# Patient Record
Sex: Male | Born: 1995 | Race: Black or African American | Hispanic: No | Marital: Single | Smoking: Never smoker
Health system: Southern US, Community
[De-identification: ages and names within clinical notes are randomized; demographics above are authoritative.]

---

## 2013-07-05 ENCOUNTER — Ambulatory Visit (INDEPENDENT_AMBULATORY_CARE_PROVIDER_SITE_OTHER): Payer: BC Managed Care – PPO | Admitting: Family Medicine

## 2013-07-05 VITALS — BP 100/66 | HR 60 | Temp 98.3°F | Resp 16 | Ht 67.0 in | Wt 174.2 lb

## 2013-07-05 DIAGNOSIS — Z0000 Encounter for general adult medical examination without abnormal findings: Secondary | ICD-10-CM

## 2013-07-05 NOTE — Progress Notes (Signed)
Patient ID: Melvin Berry MRN: [id number], DOB: [birth year] 18 y.o. Date of Encounter: 07/05/2013, 4:52 PM  Primary Physician: No primary provider on file.  Chief Complaint: Physical (CPE)  HPI: 18 y.o. y/o male with history noted below here for CPE.  Doing well. No issues/complaints.  Review of Systems: Consitutional: No fever, chills, fatigue, night sweats, lymphadenopathy, or weight changes. Eyes: No visual changes, eye redness, or discharge. ENT/Mouth: Ears: No otalgia, tinnitus, hearing loss, discharge. Nose: No congestion, rhinorrhea, sinus pain, or epistaxis. Throat: No sore throat, post nasal drip, or teeth pain. Cardiovascular: No CP, palpitations, diaphoresis, DOE, edema, orthopnea, PND. Respiratory: No cough, hemoptysis, SOB, or wheezing. Gastrointestinal: No anorexia, dysphagia, reflux, pain, nausea, vomiting, hematemesis, diarrhea, constipation, BRBPR, or melena. Genitourinary: No dysuria, frequency, urgency, hematuria, incontinence, nocturia, decreased urinary stream, discharge, impotence, or testicular pain/masses. Musculoskeletal: No decreased ROM, myalgias, stiffness, joint swelling, or weakness. Skin: No rash, erythema, lesion changes, pain, warmth, jaundice, or pruritis. Neurological: No headache, dizziness, syncope, seizures, tremors, memory loss, coordination problems, or paresthesias. Psychological: No anxiety, depression, hallucinations, SI/HI. Endocrine: No fatigue, polydipsia, polyphagia, polyuria, or known diabetes. All other systems were reviewed and are otherwise negative.  History reviewed. No pertinent past medical history.   History reviewed. No pertinent past surgical history.  Home Meds:  Prior to Admission medications   Not on File    Allergies: No Known Allergies  History   Social History  . Marital Status: Single    Spouse Name: N/A    Number of Children: N/A  . Years of Education: N/A   Occupational History  . Not on file.   Social  History Main Topics  . Smoking status: Never Smoker   . Smokeless tobacco: Not on file  . Alcohol Use: No  . Drug Use: No  . Sexual Activity: Not on file   Other Topics Concern  . Not on file   Social History Narrative  . No narrative on file    History reviewed. No pertinent family history.  Physical Exam: Blood pressure 100/66, pulse 60, temperature 98.3 F (36.8 C), temperature source Oral, resp. rate 16, height 5\' 7"  (1.702 m), weight 174 lb 3.2 oz (79.017 kg), SpO2 100.00%.  General: Well developed, well nourished, in no acute distress. HEENT: Normocephalic, atraumatic. Conjunctiva pink, sclera non-icteric. Pupils 2 mm constricting to 1 mm, round, regular, and equally reactive to light and accomodation. EOMI. Internal auditory canal clear. TMs with good cone of light and without pathology. Nasal mucosa pink. Nares are without discharge. No sinus tenderness. Oral mucosa pink. Dentition good. Pharynx without exudate.   Neck: Supple. Trachea midline. No thyromegaly. Full ROM. No lymphadenopathy. Lungs: Clear to auscultation bilaterally without wheezes, rales, or rhonchi. Breathing is of normal effort and unlabored. Cardiovascular: RRR with S1 S2. No murmurs, rubs, or gallops appreciated. Distal pulses 2+ symmetrically. No carotid or abdominal bruits Abdomen: Soft, non-tender, non-distended with normoactive bowel sounds. No hepatosplenomegaly or masses. No rebound/guarding. No CVA tenderness. Without hernias.   Genitourinary:  circumcised male. No penile lesions. Testes descended bilaterally, and smooth without tenderness or masses.  Musculoskeletal: Full range of motion and 5/5 strength throughout. Without swelling, atrophy, tenderness, crepitus, or warmth. Extremities without clubbing, cyanosis, or edema. Calves supple. Skin: Warm and moist without erythema, ecchymosis, wounds, or rash. Neuro: A+Ox3. CN II-XII grossly intact. Moves all extremities spontaneously. Full sensation  throughout. Normal gait. DTR 2+ throughout upper and lower extremities. Finger to nose intact. Psych:  Responds to questions appropriately with a normal  affect.   Studies: none  Assessment/Plan:  18 y.o. y/o  male here for CPE which is normal -  Signed, Elvina SidleKurt Aadhav Uhlig, MD 07/05/2013 4:52 PM

## 2013-07-05 NOTE — Patient Instructions (Signed)
Health Maintenance, 44- to 20-Year-Old SCHOOL PERFORMANCE After high school completion, the young adult may be attending college, Hotel manager or vocational school, or entering the [employer] or the work force. SOCIAL AND EMOTIONAL DEVELOPMENT The young adult establishes adult relationships and explores sexual identity. Young adults may be living at home or in a college dorm or apartment. Increasing independence is important with young adults. Throughout these years, young adults should assume responsibility of their own health care. RECOMMENDED IMMUNIZATIONS  Influenza vaccine.  All adults should be immunized every year.  All adults, including pregnant women and people with hives-only allergy to eggs can receive the inactivated influenza (IIV) vaccine.  Adults aged 44 49 years can receive the recombinant influenza (RIV) vaccine. The RIV vaccine does not contain any egg protein.  Tetanus, diphtheria, and acellular pertussis (Td, Tdap) vaccine.  Pregnant women should receive 1 dose of Tdap vaccine during each pregnancy. The dose should be obtained regardless of the length of time since the last dose. Immunization is preferred during the 27th to 36th week of gestation.  An adult who has not previously received Tdap or who does not know his or her vaccine status should receive 1 dose of Tdap. This initial dose should be followed by tetanus and diphtheria toxoids (Td) booster doses every 10 years.  Adults with an unknown or incomplete history of completing a 3-dose immunization series with Td-containing vaccines should begin or complete a primary immunization series including a Tdap dose.  Adults should receive a Td booster every 10 years.  Varicella vaccine.  An adult without evidence of immunity to varicella should receive 2 doses or a second dose if he or she has previously received 1 dose.  Pregnant females who do not have evidence of immunity should receive the first dose after pregnancy.  This first dose should be obtained before leaving the health care facility. The second dose should be obtained 4 8 weeks after the first dose.  Human papillomavirus (HPV) vaccine.  Females aged 15 26 years who have not received the vaccine previously should obtain the 3-dose series.  The vaccine is not recommended for use in pregnant females. However, pregnancy testing is not needed before receiving a dose. If a male is found to be pregnant after receiving a dose, no treatment is needed. In that case, the remaining doses should be delayed until after the pregnancy.  Males aged 12 21 years who have not received the vaccine previously should receive the 3-dose series. Males aged 39 26 years may be immunized.  Immunization is recommended through the age of 1 years for any male who has sex with males and did not get any or all doses earlier.  Immunization is recommended for any person with an immunocompromised condition through the age of 27 years if he or she did not get any or all doses earlier.  During the 3-dose series, the second dose should be obtained 4 8 weeks after the first dose. The third dose should be obtained 24 weeks after the first dose and 16 weeks after the second dose.  Measles, mumps, and rubella (MMR) vaccine.  Adults born in [birth year] or later should have 1 or more doses of MMR vaccine unless there is a contraindication to the vaccine or there is laboratory evidence of immunity to each of the three diseases.  A routine second dose of MMR vaccine should be obtained at least 28 days after the first dose for students attending postsecondary schools, health care workers, or international travelers.  For females of childbearing age, rubella immunity should be determined. If there is no evidence of immunity, females who are not pregnant should be vaccinated. If there is no evidence of immunity, females who are pregnant should delay immunization until after pregnancy.  Pneumococcal  13-valent conjugate (PCV13) vaccine.  When indicated, a person who is uncertain of his or her immunization history and has no record of immunization should receive the PCV13 vaccine.  An adult aged 19 years or older who has certain medical conditions and has not been previously immunized should receive 1 dose of PCV13 vaccine. This PCV13 should be followed with a dose of pneumococcal polysaccharide (PPSV23) vaccine. The PPSV23 vaccine dose should be obtained at least 8 weeks after the dose of PCV13 vaccine.  An adult aged 19 years or older who has certain medical conditions and previously received 1 or more doses of PPSV23 vaccine should receive 1 dose of PCV13. The PCV13 vaccine dose should be obtained 1 or more years after the last PPSV23 vaccine dose.  Pneumococcal polysaccharide (PPSV23) vaccine.  When PCV13 is also indicated, PCV13 should be obtained first.  An adult younger than age 65 years who has certain medical conditions should be immunized.  Any person who resides in a nursing home or long-term care facility should be immunized.  An adult smoker should be immunized.  People with an immunocompromised condition and certain other conditions should receive both PCV13 and PPSV23 vaccines.  People with human immunodeficiency virus (HIV) infection should be immunized as soon as possible after diagnosis.  Immunization during chemotherapy or radiation therapy should be avoided.  Routine use of PPSV23 vaccine is not recommended for American Indians, [place] Natives, or people younger than 65 years unless there are medical conditions that require PPSV23 vaccine.  When indicated, people who have unknown immunization and have no record of immunization should receive PPSV23 vaccine.  One-time revaccination 5 years after the first dose of PPSV23 is recommended for people aged 19 64 years who have chronic kidney failure, nephrotic syndrome, asplenia, or immunocompromised  conditions.  Meningococcal vaccine.  Adults with asplenia or persistent complement component deficiencies should receive 2 doses of quadrivalent meningococcal conjugate (MenACWY-D) vaccine. The doses should be obtained at least 2 months apart.  Microbiologists working with certain meningococcal bacteria, military recruits, people at risk during an outbreak, and people who travel to or live in countries with a high rate of meningitis should be immunized.  A first-year college student up through age 21 years who is living in a residence hall should receive a dose if he or she did not receive a dose on or after his or her 16th birthday.  Adults who have certain high-risk conditions should receive one or more doses of vaccine.  Hepatitis A vaccine.  Adults who wish to be protected from this disease, have certain high-risk conditions, work with hepatitis A-infected animals, work in hepatitis A research labs, or travel to or work in countries with a high rate of hepatitis A should be immunized.  Adults who were previously unvaccinated and who anticipate close contact with an international adoptee during the first 60 days after arrival in the [place] from a country with a high rate of hepatitis A should be immunized.  Hepatitis B vaccine.  Adults who wish to be protected from this disease, have certain high-risk conditions, may be exposed to blood or other infectious body fluids, are household contacts or sex partners of hepatitis B positive people, are clients or workers in   certain care facilities, or travel to or work in countries with a high rate of hepatitis B should be immunized.  Haemophilus influenzae type b (Hib) vaccine.  A previously unvaccinated person with asplenia or sickle cell disease or having a scheduled splenectomy should receive 1 dose of Hib vaccine.  Regardless of previous immunization, a recipient of a hematopoietic stem cell transplant should receive a 3-dose series 6  12 months after his or her successful transplant.  Hib vaccine is not recommended for adults with HIV infection. TESTING Annual screening for vision and hearing problems is recommended. Vision should be screened objectively at least once between 18 21 years of age. The young adult may be screened for anemia or tuberculosis. Young adults should have a blood test to check for high cholesterol during this time period. Young adults should be screened for use of alcohol and drugs. If the young adult is sexually active, screening for sexually transmitted infections, pregnancy, or HIV may be performed.  NUTRITION AND ORAL HEALTH  Adequate calcium intake is important. Consume 3 servings of low-fat milk and dairy products daily. For those who do not drink milk or consume dairy products, calcium enriched foods, such as juice, bread, or cereal, dark, leafy greens, or canned fish are alternate sources of calcium.  Drink plenty of water. Limit fruit juice to 8 12 ounces (240 360 mL) each day. Avoid sugary beverages or sodas.  Discourage skipping meals, especially breakfast. Young adults should eat a good variety of vegetables and fruits, as well as lean meats.  Avoid foods high in fat, salt, or sugar, such as candy, chips, and cookies.  Encourage young adults to participate in meal planning and preparation.  Eat meals together as a family whenever possible. Encourage conversation at mealtime.  Limit fast food choices and eating out at restaurants.  Brush teeth twice a day and floss.  Schedule dental exams twice a year. SLEEP Regular sleep habits are important. PHYSICAL, SOCIAL, AND EMOTIONAL DEVELOPMENT  One hour of regular physical activity daily is recommended. Continue to participate in sports.  Encourage young adults to develop their own interests and consider community service or volunteerism.  Provide guidance to the young adult in making decisions about college and work plans.  Make sure  that young adults know that they should never be in a situation that makes them uncomfortable, and they should tell partners if they do not want to engage in sexual activity.  Talk to the young adult about body image. Eating disorders may be noted at this time. Young adults may also be concerned about being overweight. Monitor the young adult for weight gain or loss.  Mood disturbances, depression, anxiety, alcoholism, or attention problems may be noted in young adults. Talk to the caregiver if there are concerns about mental illness.  Negotiate limit setting and independent decision making.  Encourage the young adult to handle conflict without physical violence.  Avoid loud noises which may impair hearing.  Limit television and computer time to 2 hours each day. Individuals who engage in excessive sedentary activity are more likely to become overweight. RISK BEHAVIORS  Sexually active young adults need to take precautions against pregnancy and sexually transmitted infections. Talk to young adults about contraception.  Provide a tobacco-free and drug-free environment for the young adult. Talk to the young adult about drug, tobacco, and alcohol use among friends or at friend's homes. Make sure the young adult knows that smoking tobacco or marijuana and taking drugs have health consequences and   may impact brain development.  Teach the young adult about appropriate use of over-the-counter or prescription medicines.  Establish guidelines for driving and for riding with friends.  Talk to young adults about the risks of drinking and driving or boating. Encourage the young adult to call you if he or she or friends have been drinking or using drugs.  Remind young adults to wear seat belts at all times in cars and life vests in boats.  Young adults should always wear a properly fitted helmet when they are riding a bicycle.  Use caution with all-terrain vehicles (ATVs) or other motorized  vehicles.  Do not keep handguns in the home. (If you do, the gun and ammunition should be locked separately and out of the young adult's access.)  Equip your home with smoke detectors and change the batteries regularly. Make sure all family members know the fire escape plans for your home.  Teach young adults not to swim alone and not to dive in shallow water.  All individuals should wear sunscreen when out in the sun. This minimizes sunburning. WHAT'S NEXT? Young adults should visit their pediatrician or family physician yearly. By young adulthood, health care should be transitioned to a family physician or internal medicine specialist. Sexually active females may want to begin annual physical exams with a gynecologist. Document Released: 08/28/2006 Document Revised: 09/27/2012 Document Reviewed: 09/17/2006 ExitCare Patient Information 2014 [employer].  

## 2013-10-02 DIAGNOSIS — S022XXA Fracture of nasal bones, initial encounter for closed fracture: Secondary | ICD-10-CM | POA: Insufficient documentation

## 2013-10-02 DIAGNOSIS — Y92838 Other recreation area as the place of occurrence of the external cause: Secondary | ICD-10-CM

## 2013-10-02 DIAGNOSIS — W219XXA Striking against or struck by unspecified sports equipment, initial encounter: Secondary | ICD-10-CM | POA: Insufficient documentation

## 2013-10-02 DIAGNOSIS — Y9374 Activity, frisbee: Secondary | ICD-10-CM | POA: Insufficient documentation

## 2013-10-02 DIAGNOSIS — Y9239 Other specified sports and athletic area as the place of occurrence of the external cause: Secondary | ICD-10-CM | POA: Insufficient documentation

## 2013-10-03 ENCOUNTER — Emergency Department (HOSPITAL_COMMUNITY)
Admission: EM | Admit: 2013-10-03 | Discharge: 2013-10-03 | Disposition: A | Discharge status: Home / Self Care | Payer: BC Managed Care – PPO | Attending: Emergency Medicine | Admitting: Emergency Medicine

## 2013-10-03 ENCOUNTER — Encounter (HOSPITAL_COMMUNITY): Payer: Self-pay | Admitting: Emergency Medicine

## 2013-10-03 ENCOUNTER — Emergency Department (HOSPITAL_COMMUNITY): Payer: BC Managed Care – PPO

## 2013-10-03 DIAGNOSIS — S022XXA Fracture of nasal bones, initial encounter for closed fracture: Secondary | ICD-10-CM

## 2013-10-03 MED ORDER — IBUPROFEN 600 MG PO TABS: 600.0000 mg | ORAL_TABLET | Freq: Four times a day (QID) | ORAL | Status: DC | PRN

## 2013-10-03 NOTE — ED Notes (Signed)
Pt here for c/o swollen nose from injury while playing frisbee

## 2013-10-03 NOTE — ED Notes (Signed)
Thursday playing ultimate frisbee and a taller man reached over him and when the taller guy pulled his arm arm hit the patient on the nose.  Nose swollen, no bleeding.

## 2013-10-03 NOTE — Discharge Instructions (Signed)
Apply ice pack to the nose several times a day. Ibuprofen for pain. He did have a fracture of the left nasal bone. Followup with ear nose throat doctor as referred.  Nasal Fracture A nasal fracture is a break or crack in the bones of the nose. A minor break usually heals in a month. You often will receive black eyes from a nasal fracture. This is not a cause for concern. The black eyes will go away over 1 to 2 weeks.  DIAGNOSIS  Your caregiver may want to examine you if you are concerned about a fracture of the nose. X-rays of the nose may not show a nasal fracture even when one is present. Sometimes your caregiver must wait 1 to 5 days after the injury to re-check the nose for alignment and to take additional X-rays. Sometimes the caregiver must wait until the swelling has gone down. TREATMENT Minor fractures that have caused no deformity often do not require treatment. More serious fractures where bones are displaced may require surgery. This will take place after the swelling is gone. Surgery will stabilize and align the fracture. HOME CARE INSTRUCTIONS   Put ice on the injured area.  Put ice in a plastic bag.  Place a towel between your skin and the bag.  Leave the ice on for 15-20 minutes, 03-04 times a day.  Take medications as directed by your caregiver.  Only take over-the-counter or prescription medicines for pain, discomfort, or fever as directed by your caregiver.  If your nose starts bleeding, squeeze the soft parts of the nose against the center wall while you are sitting in an upright position for 10 minutes.  Contact sports should be avoided for at least 3 to 4 weeks or as directed by your caregiver. SEEK MEDICAL CARE IF:  Your pain increases or becomes severe.  You continue to have nosebleeds.  The shape of your nose does not return to normal within 5 days.  You have pus draining from the nose. SEEK IMMEDIATE MEDICAL CARE IF:   You have bleeding from your nose that  does not stop after 20 minutes of pinching the nostrils closed and keeping ice on the nose.  You have clear fluid draining from your nose.  You notice a grape-like swelling on the dividing wall between the nostrils (septum). This is a collection of blood (hematoma) that must be drained to help prevent infection.  You have difficulty moving your eyes.  You have recurrent vomiting. Document Released: 05/30/2000 Document Revised: 08/25/2011 Document Reviewed: 09/16/2010 [facility] Patient Information 2014 [place], [place].

## 2013-10-03 NOTE — ED Provider Notes (Signed)
CSN: [id number]     Arrival date & time 10/02/13  2347 History   First MD Initiated Contact with Patient 10/03/13 0030     Chief Complaint  Patient presents with  . Facial Injury     (Consider location/radiation/quality/duration/timing/severity/associated sxs/prior Treatment) HPI Melvin Berry is a 18 y.o. male [place] [facility] after an injury to his nose. States the injury occurred 3 days ago while playing ultimate Frisbee and another player hit in the nose. Patient reports that he continues to have pain in his nose. He denies any nose bleeding. He denies any difficulty breathing. He states he did not try any treatment for this. States that he feels like his nose is crooked. No other complaints. Denies any headaches, no loss of consciousness, no other complaints.  History reviewed. No pertinent past medical history. History reviewed. No pertinent past surgical history. History reviewed. No pertinent family history. History  Substance Use Topics  . Smoking status: Never Smoker   . Smokeless tobacco: Not on file  . Alcohol Use: No    Review of Systems  Constitutional: Negative for fever and chills.  HENT: Positive for facial swelling. Negative for nosebleeds.   Respiratory: Negative for shortness of breath.   Skin: Negative for wound.  Neurological: Negative for headaches.      Allergies  Review of patient's allergies indicates no known allergies.  Home Medications   Prior to Admission medications   Not on File   BP 141/72  Pulse 85  Temp(Src) 98.5 F (36.9 C) (Oral)  Resp 20  Ht 5\' 7"  (1.702 m)  Wt 186 lb (84.369 kg)  BMI 29.12 kg/m2  SpO2 98% Physical Exam  Nursing note and vitals reviewed. Constitutional: He appears well-developed and well-nourished. No distress.  HENT:  Head: Normocephalic and atraumatic.  Right Ear: External ear normal.  Left Ear: External ear normal.  Mouth/Throat: Oropharynx is clear and moist.  Mild swelling noted to the bridge  of the nose. Tender to palpation. Nasal canal normal with no septal hematoma.  Eyes: Conjunctivae and EOM are normal. Pupils are equal, round, and reactive to light.  Neck: Neck supple.  Cardiovascular: Normal rate, regular rhythm and normal heart sounds.   Pulmonary/Chest: Effort normal. No respiratory distress. He has no wheezes. He has no rales.  Musculoskeletal: He exhibits no edema.  Neurological: He is alert.  Skin: Skin is warm and dry.    ED Course  Procedures (including critical care time) Labs Review Labs Reviewed - No data to display  Imaging Review Dg Nasal Bones  10/03/2013   CLINICAL DATA:  Pain and swelling in the nose after injury.  EXAM: NASAL BONES - 3+ VIEW  COMPARISON:  None.  FINDINGS: Transverse fracture of the distal nasal bone with mild depression. Paranasal sinuses appear clear. Nasal septum is not displaced.  IMPRESSION: Transverse fracture distal left nasal bone with mild depression.   Electronically Signed   By: Burman NievesWilliam  Stevens M.D.   On: 10/03/2013 01:13     EKG Interpretation None      MDM   Final diagnoses:  Nasal bone fracture    Patient with nasal injury 3 days ago. No trouble breathing. No septal hematoma. No epistaxis. X-ray showed dense first fractured distal left nasal bone with mild depression. Will refer to ear nose throat specialist. No further treatment indicated in emergency department.  Filed Vitals:   10/03/13 0001  BP: 141/72  Pulse: 85  Temp: 98.5 F (36.9 C)  TempSrc: Oral  Resp:  20  Height: 5\' 7"  (1.702 m)  Weight: 186 lb (84.369 kg)  SpO2: 98%       Catia Todorov A Teshaun Olarte, PA-C 10/03/13 0151

## 2013-10-03 NOTE — ED Provider Notes (Signed)
Medical screening examination/treatment/procedure(s) were performed by non-physician practitioner and as supervising physician I was immediately available for consultation/collaboration.   EKG Interpretation None        Loren Raceravid Amrie Gurganus, MD 10/03/13 [phone]

## 2014-04-19 ENCOUNTER — Emergency Department (HOSPITAL_COMMUNITY)
Admission: EM | Admit: 2014-04-19 | Discharge: 2014-04-19 | Disposition: A | Discharge status: Home / Self Care | Payer: BC Managed Care – PPO | Attending: Emergency Medicine | Admitting: Emergency Medicine

## 2014-04-19 ENCOUNTER — Encounter (HOSPITAL_COMMUNITY): Payer: Self-pay | Admitting: Emergency Medicine

## 2014-04-19 DIAGNOSIS — J069 Acute upper respiratory infection, unspecified: Secondary | ICD-10-CM

## 2014-04-19 DIAGNOSIS — R509 Fever, unspecified: Secondary | ICD-10-CM | POA: Diagnosis present

## 2014-04-19 MED ORDER — ALBUTEROL SULFATE HFA 108 (90 BASE) MCG/ACT IN AERS: 2.0000 | INHALATION_SPRAY | RESPIRATORY_TRACT | Status: AC | PRN

## 2014-04-19 MED ORDER — DM-GUAIFENESIN ER 30-600 MG PO TB12: 1.0000 | ORAL_TABLET | Freq: Two times a day (BID) | ORAL | Status: DC

## 2014-04-19 NOTE — Discharge Instructions (Signed)
Follow the directions provided. Take the medications as directed. If your symptoms do not resolve in the next 7-10 days, you should follow-up with your primary care doctor to ensure you're getting better. Don't hesitate to return for any new, worsening, or concerning symptoms.  SEEK IMMEDIATE MEDICAL CARE IF:  You have a fever.  You develop severe or persistent headache, ear pain, sinus pain, or chest pain.  You develop wheezing, a prolonged cough, cough up blood, or have a change in your usual mucus (if you have chronic lung disease).  You develop sore muscles or a stiff neck.

## 2014-04-19 NOTE — ED Notes (Signed)
States having cold sx with fever, started Thursday -- was at a funeral over the weekend, possibly exposed to whooping cough---per pt. States that his mother texted him to make him aware of possible exposure.

## 2014-04-19 NOTE — ED Provider Notes (Signed)
CSN: [id number]     Arrival date & time 04/19/14  1406 History  This chart was scribed for non-physician practitioner, Melvin BattiestElizabeth Raigan Baria, FNP,working with Purvis SheffieldForrest Harrison, MD, by Karle PlumberJennifer Tensley, ED Scribe. This patient was seen in room TR09C/TR09C and the patient's care was started at 2:50 PM.  Chief Complaint  Patient presents with  . URI  . Fever   The history is provided by the patient. No language interpreter was used.    HPI Comments:  Melvin Berry is a 18 y.o. male who presents to the Emergency Department complaining of a nonproductive cough that has been ongoing for 4-5 days. Pt is concerned because his sister may have been exposed to pertussis and he was around her. He reports he is enrolled in school so his TDAP vaccination should be UTD. He reports associated subjective fever with chills, mild rhinorrhea, congestion and sore throat. He states he took a Mucinex with complete relief of the symptoms but states they returned. Denies worsening factors. Denies SOB, chest tightness, HA, nausea or vomiting. Reports year round seasonal allergies in which he takes Allegra daily.  History reviewed. No pertinent past medical history. History reviewed. No pertinent past surgical history. No family history on file. History  Substance Use Topics  . Smoking status: Never Smoker   . Smokeless tobacco: Not on file  . Alcohol Use: No    Review of Systems  Constitutional: Positive for chills. Negative for fever.  HENT: Positive for congestion, rhinorrhea and sore throat.   Respiratory: Positive for cough. Negative for chest tightness and shortness of breath.   Gastrointestinal: Negative for nausea and vomiting.  Neurological: Negative for headaches.    Allergies  Review of patient's allergies indicates no known allergies.  Home Medications   Prior to Admission medications   Medication Sig Start Date End Date Taking? Authorizing Provider  ibuprofen (ADVIL,MOTRIN) 600 MG tablet Take 1  tablet (600 mg total) by mouth every 6 (six) hours as needed. 10/03/13   Tatyana A Kirichenko, PA-C   Triage Vitals: BP 127/68 mmHg  Pulse 75  Temp(Src) 97.9 F (36.6 C) (Oral)  Resp 18  Ht 5\' 7"  (1.702 m)  Wt 185 lb (83.915 kg)  BMI 28.97 kg/m2  SpO2 97% Physical Exam  Constitutional: He is oriented to person, place, and time. He appears well-developed and well-nourished.  HENT:  Head: Normocephalic and atraumatic.  Mouth/Throat: Uvula is midline, oropharynx is clear and moist and mucous membranes are normal. No oropharyngeal exudate.  Eyes: Conjunctivae and EOM are normal.  Neck: Normal range of motion.  Cardiovascular: Normal rate.   Pulmonary/Chest: Effort normal and breath sounds normal. No respiratory distress. He has no decreased breath sounds. He has no wheezes. He has no rhonchi. He has no rales. He exhibits no tenderness.  Musculoskeletal: Normal range of motion.  Lymphadenopathy:    He has no cervical adenopathy.  Neurological: He is alert and oriented to person, place, and time.  Skin: Skin is warm and dry.  Psychiatric: He has a normal mood and affect. His behavior is normal.  Nursing note and vitals reviewed.   ED Course  Procedures (including critical care time) DIAGNOSTIC STUDIES: Oxygen Saturation is 97% on RA, normal by my interpretation.   COORDINATION OF CARE: 3:00 PM- Advised pt to continue taking his Allegra daily. Will prescribe Guaifenesin and Albuterol MDI. Pt verbalizes understanding and agrees to plan.   Labs Review Labs Reviewed - No data to display  Imaging Review No results found.  EKG Interpretation None      MDM   Final diagnoses:  URI (upper respiratory infection)   18 yo male with symptoms consistent with viral URI. Discussed that antibiotics are not indicated for viral infections. Pt will be discharged with symptomatic mgmt. He verbalizes understanding and is agreeable with plan. Pt is hemodynamically stable & in NAD prior to  dc. Return precautions provided.  I personally performed the services described in this documentation, which was scribed in my presence. The recorded information has been reviewed and is accurate.  Filed Vitals:   04/19/14 1411 04/19/14 1517  BP: 127/68 96/70  Pulse: 75 76  Temp: 97.9 F (36.6 C) 97.9 F (36.6 C)  TempSrc: Oral Oral  Resp: 18 18  Height: 5\' 7"  (1.702 m)   Weight: 185 lb (83.915 kg)   SpO2: 97% 97%   Meds given in ED:  Medications - No data to display  Discharge Medication List as of 04/19/2014  3:11 PM    START taking these medications   Details  albuterol (PROVENTIL HFA;VENTOLIN HFA) 108 (90 BASE) MCG/ACT inhaler Inhale 2 puffs into the lungs every 4 (four) hours as needed for wheezing or shortness of breath., Starting 04/19/2014, Until Discontinued, Print    dextromethorphan-guaiFENesin (MUCINEX DM) 30-600 MG per 12 hr tablet Take 1 tablet by mouth 2 (two) times daily., Starting 04/19/2014, Until Discontinued, Print           Melvin BattiestElizabeth Izabelle Daus, NP 04/21/14 1713  Purvis SheffieldForrest Harrison, MD 04/22/14 1017

## 2014-04-19 NOTE — ED Notes (Signed)
Pts vital signs updated pt awaiting discharge paperwork at bedside.  

## 2014-07-04 ENCOUNTER — Encounter (HOSPITAL_COMMUNITY): Payer: Self-pay | Admitting: Emergency Medicine

## 2014-07-04 ENCOUNTER — Emergency Department (HOSPITAL_COMMUNITY)
Admission: EM | Admit: 2014-07-04 | Discharge: 2014-07-04 | Disposition: A | Discharge status: Home / Self Care | Payer: Self-pay | Attending: Emergency Medicine | Admitting: Emergency Medicine

## 2014-07-04 DIAGNOSIS — S01112A Laceration without foreign body of left eyelid and periocular area, initial encounter: Secondary | ICD-10-CM | POA: Insufficient documentation

## 2014-07-04 DIAGNOSIS — Y998 Other external cause status: Secondary | ICD-10-CM | POA: Insufficient documentation

## 2014-07-04 DIAGNOSIS — Y92310 Basketball court as the place of occurrence of the external cause: Secondary | ICD-10-CM | POA: Insufficient documentation

## 2014-07-04 DIAGNOSIS — Y9367 Activity, basketball: Secondary | ICD-10-CM | POA: Insufficient documentation

## 2014-07-04 DIAGNOSIS — W500XXA Accidental hit or strike by another person, initial encounter: Secondary | ICD-10-CM | POA: Insufficient documentation

## 2014-07-04 DIAGNOSIS — Z79899 Other long term (current) drug therapy: Secondary | ICD-10-CM | POA: Insufficient documentation

## 2014-07-04 NOTE — ED Notes (Signed)
Pt. presents with approx. 1/2 inch laceration at left upper eyebrow with minimal bleeding elbowed while playing basketball this evening , no LOC , alert and oriented , denies visual problems .

## 2014-07-04 NOTE — Discharge Instructions (Signed)
dermabond will slough off over the next few days, try not to pull it off. May shower and bathe as normal. Return to the ED for new or worsening symptoms.

## 2014-07-04 NOTE — ED Provider Notes (Signed)
CSN: [id number]     Arrival date & time 07/04/14  1934 History   First MD Initiated Contact with Patient 07/04/14 1947     Chief Complaint  Patient presents with  . Head Laceration     (Consider location/radiation/quality/duration/timing/severity/associated sxs/prior Treatment) Patient is a 19 y.o. male presenting with scalp laceration. The history is provided by the patient and medical records.  Head Laceration   this is a 19 year old male with no significant past medical history presenting to the ED for a laceration of his left eyebrow. He states he was playing basketball and got elbowed on side of his head. No loss of consciousness. He has had minimal bleeding from the laceration. He denies any dizziness, lightheadedness, visual disturbance.  Tetanus is up-to-date.  History reviewed. No pertinent past medical history. History reviewed. No pertinent past surgical history. No family history on file. History  Substance Use Topics  . Smoking status: Never Smoker   . Smokeless tobacco: Not on file  . Alcohol Use: No    Review of Systems  Skin: Positive for wound.  All other systems reviewed and are negative.     Allergies  Review of patient's allergies indicates no known allergies.  Home Medications   Prior to Admission medications   Medication Sig Start Date End Date Taking? Authorizing Provider  albuterol (PROVENTIL HFA;VENTOLIN HFA) 108 (90 BASE) MCG/ACT inhaler Inhale 2 puffs into the lungs every 4 (four) hours as needed for wheezing or shortness of breath. 04/19/14   Harle Battiest, NP  dextromethorphan-guaiFENesin [facility] DM) 30-600 MG per 12 hr tablet Take 1 tablet by mouth 2 (two) times daily. 04/19/14   Harle Battiest, NP  ibuprofen (ADVIL,MOTRIN) 600 MG tablet Take 1 tablet (600 mg total) by mouth every 6 (six) hours as needed. 10/03/13   Tatyana A Kirichenko, PA-C   BP 135/73 mmHg  Temp(Src) 98.5 F (36.9 C) (Oral)  Resp 14  Ht  (1.702 m)  Wt 187 lb  (84.823 kg)  BMI 29.28 kg/m2  SpO2 97% Physical Exam  Constitutional: He is oriented to person, place, and time. He appears well-developed and well-nourished.  HENT:  Head: Normocephalic. Head is with laceration.  Mouth/Throat: Oropharynx is clear and moist.  1 cm superficial laceration along the lateral aspect of left eyebrow, no active bleeding, no bony deformities  Eyes: Conjunctivae and EOM are normal. Pupils are equal, round, and reactive to light.  Neck: Normal range of motion.  Cardiovascular: Normal rate, regular rhythm and normal heart sounds.   Pulmonary/Chest: Effort normal and breath sounds normal. No respiratory distress. He has no wheezes.  Abdominal: Soft. Bowel sounds are normal.  Musculoskeletal: Normal range of motion.  Neurological: He is alert and oriented to person, place, and time.  AAOx3, answering questions appropriately; equal strength UE and LE bilaterally; CN grossly intact; moves all extremities appropriately without ataxia; no focal neuro deficits or facial asymmetry appreciated  Skin: Skin is warm and dry.  Psychiatric: He has a normal mood and affect.  Nursing note and vitals reviewed.   ED Course  Procedures (including critical care time)  LACERATION REPAIR Performed by: Garlon Hatchet Authorized by: Garlon Hatchet Consent: Verbal consent obtained. Risks and benefits: risks, benefits and alternatives were discussed Consent given by: patient Patient identity confirmed: provided demographic data Prepped and Draped in normal sterile fashion Wound explored  Laceration Location: left eyebrow  Laceration Length: 1cm, superficial  No Foreign Bodies seen or palpated  Anesthesia: local infiltration  Local anesthetic:  none  Anesthetic total: 0 ml  Irrigation method: syringe Amount of cleaning: standard  Skin closure: dermabond  Number of sutures: 0  Technique: n/a  Patient tolerance: Patient tolerated the procedure well with no immediate  complications.  Labs Review Labs Reviewed - No data to display  Imaging Review No results found.   EKG Interpretation None      MDM   Final diagnoses:  Eyebrow laceration, left, initial encounter   19 year old male with superficial laceration of left eyebrow. There is no active bleeding. Neurologic exam nonfocal. Laceration repaired with Dermabond, patient tolerated well. Patient d/c home in stable condition.  Garlon HatchetLisa M Lott Seelbach, PA-C 07/04/14 2025  Arby BarretteMarcy Pfeiffer, MD 07/05/14 [phone]

## 2014-09-02 IMAGING — CR DG NASAL BONES 3+V
3 series · 3 of 3 positions shown · non-contrast
Comparison: None.

CLINICAL DATA: Pain and swelling in the nose after injury.

EXAM:
NASAL BONES - 3+ VIEW

[w waters *]
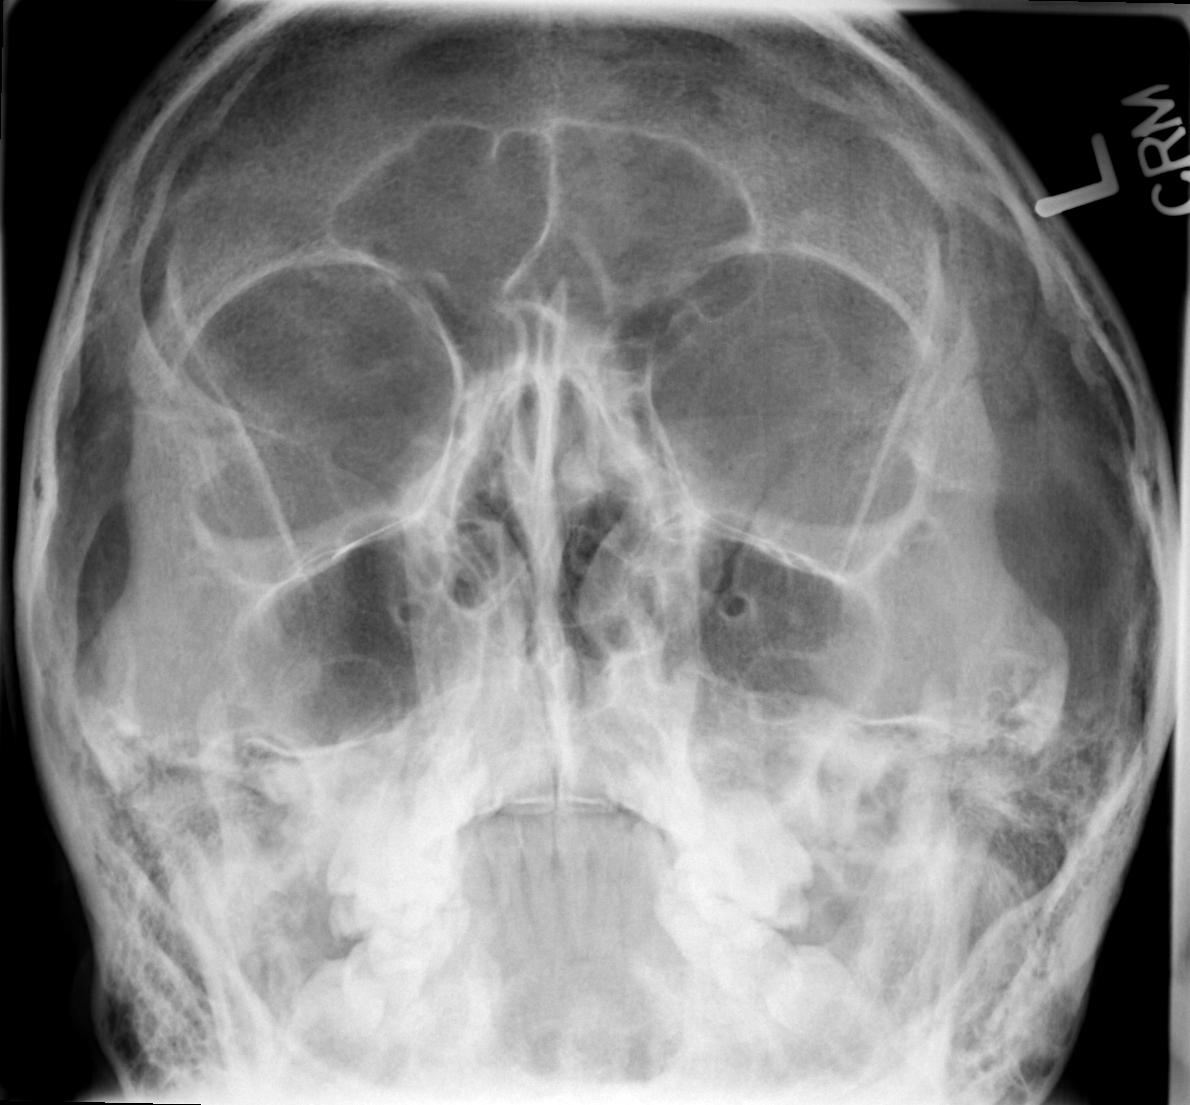

[w nasal bone lat * (1 of 2)]
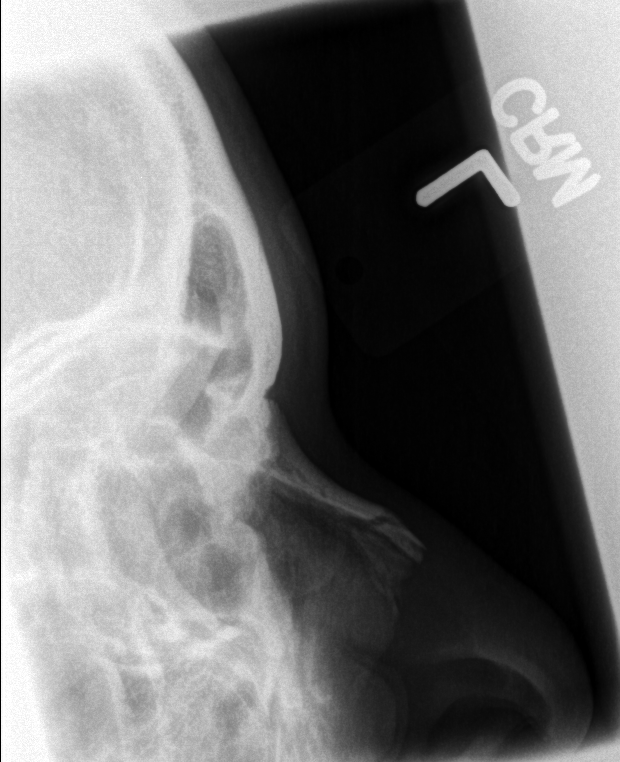

[w nasal bone lat * (2 of 2)]
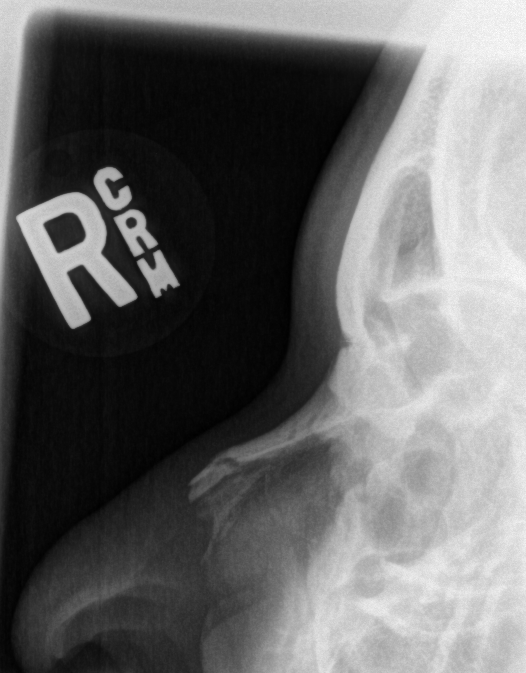

[3 of 3 positions shown; findings below may reference images not displayed]

FINDINGS: Transverse fracture of the distal nasal bone with mild depression.
Paranasal sinuses appear clear. Nasal septum is not displaced.
IMPRESSION: Transverse fracture distal left nasal bone with mild depression.

## 2016-04-27 ENCOUNTER — Encounter (HOSPITAL_COMMUNITY): Payer: Self-pay

## 2016-04-27 ENCOUNTER — Emergency Department (HOSPITAL_COMMUNITY)
Admission: EM | Admit: 2016-04-27 | Discharge: 2016-04-28 | Disposition: A | Discharge status: Home / Self Care | Payer: BLUE CROSS/BLUE SHIELD | Attending: Emergency Medicine | Admitting: Emergency Medicine

## 2016-04-27 DIAGNOSIS — Y929 Unspecified place or not applicable: Secondary | ICD-10-CM | POA: Insufficient documentation

## 2016-04-27 DIAGNOSIS — W268XXA Contact with other sharp object(s), not elsewhere classified, initial encounter: Secondary | ICD-10-CM | POA: Insufficient documentation

## 2016-04-27 DIAGNOSIS — Y939 Activity, unspecified: Secondary | ICD-10-CM | POA: Insufficient documentation

## 2016-04-27 DIAGNOSIS — Z79899 Other long term (current) drug therapy: Secondary | ICD-10-CM | POA: Insufficient documentation

## 2016-04-27 DIAGNOSIS — Y999 Unspecified external cause status: Secondary | ICD-10-CM | POA: Insufficient documentation

## 2016-04-27 DIAGNOSIS — S61011A Laceration without foreign body of right thumb without damage to nail, initial encounter: Secondary | ICD-10-CM | POA: Diagnosis not present

## 2016-04-27 MED ORDER — BUPIVACAINE HCL (PF) 0.5 % IJ SOLN
20.0000 mL | Freq: Once | INTRAMUSCULAR | Status: AC
  Administered 2016-04-27: 30 mL
  Filled 2016-04-27: qty 30

## 2016-04-27 NOTE — ED Triage Notes (Signed)
PT STS HE CUT HIS RIGHT THUMB WITH A BOX CUTTER WHILE DOING A CLASS PROJECT. MINIMAL BLEEDING AT PRESENT.

## 2016-04-27 NOTE — ED Provider Notes (Signed)
[facility]-EMERGENCY DEPT Provider Note   CSN: [id number] Arrival date & time: 04/27/16  2229     History   Chief Complaint Chief Complaint  Patient presents with  . Laceration    HPI Melvin Berry is a 20 y.o. male with no significant past medical history who presents to the ED today to be evaluated for a laceration to his right thumb. Patient states he was doing a school project and was using a box cutter to cut his supplies and the box cutter slipped and sliced his right thumb. Injury occurred less than an hour prior to arrival. Last tetanus was in the last 2 years. He has normal sensation and range of motion of his thumb.  HPI  History reviewed. No pertinent past medical history.  There are no active problems to display for this patient.   History reviewed. No pertinent surgical history.     Home Medications    Prior to Admission medications   Medication Sig Start Date End Date Taking? Authorizing Provider  albuterol (PROVENTIL HFA;VENTOLIN HFA) 108 (90 BASE) MCG/ACT inhaler Inhale 2 puffs into the lungs every 4 (four) hours as needed for wheezing or shortness of breath. Patient not taking: Reported on 07/04/2014 04/19/14   Harle BattiestElizabeth Tysinger, NP  dextromethorphan-guaiFENesin [facility](MUCINEX DM) 30-600 MG per 12 hr tablet Take 1 tablet by mouth 2 (two) times daily. Patient not taking: Reported on 07/04/2014 04/19/14   Harle BattiestElizabeth Tysinger, NP  diphenhydrAMINE (BENADRYL) 25 MG tablet Take 25 mg by mouth every 6 (six) hours as needed for allergies.    Historical Provider, MD  ibuprofen (ADVIL,MOTRIN) 600 MG tablet Take 1 tablet (600 mg total) by mouth every 6 (six) hours as needed. 10/03/13   Jaynie Crumbleatyana Kirichenko, PA-C    Family History History reviewed. No pertinent family history.  Social History Social History  Substance Use Topics  . Smoking status: Never Smoker  . Smokeless tobacco: Never Used  . Alcohol use No     Allergies   Patient has no known allergies.   Review of  Systems Review of Systems  All other systems reviewed and are negative.    Physical Exam Updated Vital Signs BP 135/73 (BP Location: Left Arm)   Pulse 84   Temp 98.3 F (36.8 C) (Oral)   Resp 16   Ht 5\' 7"  (1.702 m)   Wt 88.9 kg   SpO2 97%   BMI 30.70 kg/m   Physical Exam  Constitutional: He is oriented to person, place, and time. He appears well-developed and well-nourished. No distress.  HENT:  Head: Normocephalic and atraumatic.  Eyes: Conjunctivae are normal. Right eye exhibits no discharge. Left eye exhibits no discharge. No scleral icterus.  Cardiovascular: Normal rate.   Pulmonary/Chest: Effort normal.  Musculoskeletal: Normal range of motion.  No evidence of tendon injury.  Neurological: He is alert and oriented to person, place, and time. Coordination normal.  Skin: Skin is warm and dry. No rash noted. He is not diaphoretic. No erythema. No pallor.  2cm, laceration to finger pad of rightthumb. No FB seen or palpated.  Psychiatric: He has a normal mood and affect. His behavior is normal.  Nursing note and vitals reviewed.    ED Treatments / Results  Labs (all labs ordered are listed, but only abnormal results are displayed) Labs Reviewed - No data to display  EKG  EKG Interpretation None       Radiology No results found.  Procedures Procedures (including critical care time)  NERVE BLOCK Performed by:  Dub MikesSamantha Tripp Dowless Consent: Verbal consent obtained. Required items: required blood products, implants, devices, and special equipment available Time out: Immediately prior to procedure a "time out" was called to verify the correct patient, procedure, equipment, support staff and site/side marked as required.  Indication: laceration repair Nerve block body site: digital  Preparation: Patient was prepped and draped in the usual sterile fashion. Needle gauge: 24 G Location technique: anatomical landmarks  Local anesthetic:  bupivicaine  Anesthetic total: 7 ml  Outcome: pain improved Patient tolerance: Patient tolerated the procedure well with no immediate complications.  LACERATION REPAIR Performed by: Dub MikesSamantha Tripp Dowless Authorized by: Dub MikesSamantha Tripp Dowless Consent: Verbal consent obtained. Risks and benefits: risks, benefits and alternatives were discussed Consent given by: patient Patient identity confirmed: provided demographic data Prepped and Draped in normal sterile fashion Wound explored  Laceration Location: right thumb  Laceration Length: 2cm  No Foreign Bodies seen or palpated  Anesthesia: local infiltration  Local anesthetic:Bupivicaine  Anesthetic total: 7 ml  Irrigation method: syringe Amount of cleaning: standard  Skin closure: approximated  Number of sutures: 5  Technique: simpleinterrupted  Patient tolerance: Patient tolerated the procedure well with no immediate complications.   Medications Ordered in ED Medications  bupivacaine (MARCAINE) 0.5 % injection 20 mL (not administered)     Initial Impression / Assessment and Plan / ED Course  I have reviewed the triage vital signs and the nursing notes.  Pertinent labs & imaging results that were available during my care of the patient were reviewed by me and considered in my medical decision making (see chart for details).  Clinical Course     Tdap UTD. Pressure irrigation performed. Laceration occurred < 8 hours prior to repair which was well tolerated. Pt has no co morbidities to effect normal wound healing. Discussed suture home care w pt and answered questions. Pt to f-u for wound check and suture removal in 7 days. Pt is hemodynamically stable w no complaints prior to dc.     Final Clinical Impressions(s) / ED Diagnoses   Final diagnoses:  Laceration of right thumb without foreign body without damage to nail, initial encounter    New Prescriptions New Prescriptions   No medications on file      Dub MikesSamantha Tripp Dowless, PA-C 04/30/16 1223    Lorre NickAnthony Allen, MD 05/01/16 [phone]

## 2016-04-28 MED ORDER — BUPIVACAINE HCL (PF) 0.5 % IJ SOLN
50.0000 mL | Freq: Once | INTRAMUSCULAR | Status: AC
  Administered 2016-04-28: 30 mL
  Filled 2016-04-28: qty 60

## 2016-04-28 NOTE — Discharge Instructions (Signed)
Wash with antibacterial soap and water. Follow up in 1 week for suture removal. Return to the ED if you experience severe worsening of your symptoms, redness or swelling around your wound, fever or chills.

## 2016-08-12 ENCOUNTER — Emergency Department (HOSPITAL_COMMUNITY)
Admission: EM | Admit: 2016-08-12 | Discharge: 2016-08-13 | Disposition: A | Discharge status: Home / Self Care | Payer: BLUE CROSS/BLUE SHIELD | Attending: Emergency Medicine | Admitting: Emergency Medicine

## 2016-08-12 ENCOUNTER — Encounter (HOSPITAL_COMMUNITY): Payer: Self-pay | Admitting: Emergency Medicine

## 2016-08-12 DIAGNOSIS — Z79899 Other long term (current) drug therapy: Secondary | ICD-10-CM | POA: Insufficient documentation

## 2016-08-12 DIAGNOSIS — X500XXA Overexertion from strenuous movement or load, initial encounter: Secondary | ICD-10-CM | POA: Insufficient documentation

## 2016-08-12 DIAGNOSIS — Y929 Unspecified place or not applicable: Secondary | ICD-10-CM | POA: Diagnosis not present

## 2016-08-12 DIAGNOSIS — S299XXA Unspecified injury of thorax, initial encounter: Secondary | ICD-10-CM | POA: Diagnosis present

## 2016-08-12 DIAGNOSIS — Y999 Unspecified external cause status: Secondary | ICD-10-CM | POA: Insufficient documentation

## 2016-08-12 DIAGNOSIS — Y939 Activity, unspecified: Secondary | ICD-10-CM | POA: Diagnosis not present

## 2016-08-12 DIAGNOSIS — S29012A Strain of muscle and tendon of back wall of thorax, initial encounter: Secondary | ICD-10-CM

## 2016-08-12 NOTE — ED Triage Notes (Signed)
Pt states that he has had upper back pain x 1 week. Tried aleve w/o relief. Thinks he may have pulled a muscle when he was lifting weights. Alert and oriented.

## 2016-08-13 MED ORDER — METHOCARBAMOL 500 MG PO TABS: 500.0000 mg | ORAL_TABLET | Freq: Two times a day (BID) | ORAL | 0 refills | Status: DC | PRN

## 2016-08-13 MED ORDER — NAPROXEN 500 MG PO TABS: 500.0000 mg | ORAL_TABLET | Freq: Two times a day (BID) | ORAL | 0 refills | Status: DC | PRN

## 2016-08-13 NOTE — Discharge Instructions (Signed)
Naproxen as needed for pain. Robaxin is your muscle relaxer - This can make you very drowsy - please do not drink alcohol, operate heavy machinery or drive on this medication.   COLD THERAPY DIRECTIONS:  Ice or gel packs can be used to reduce both pain and swelling. Ice is the most helpful within the first 24 to 48 hours after an injury or flareup from overusing a muscle or joint.  Ice is effective, has very few side effects, and is safe for most people to use.   If you expose your skin to cold temperatures for too long or without the proper protection, you can damage your skin or nerves. Watch for signs of skin damage due to cold.   HOME CARE INSTRUCTIONS  Follow these tips to use ice and cold packs safely.  Place a dry or damp towel between the ice and skin. A damp towel will cool the skin more quickly, so you may need to shorten the time that the ice is used.  For a more rapid response, add gentle compression to the ice.  Ice for no more than 10 to 20 minutes at a time. The bonier the area you are icing, the less time it will take to get the benefits of ice.  Check your skin after 5 minutes to make sure there are no signs of a poor response to cold or skin damage.  Rest 20 minutes or more in between uses.  Once your skin is numb, you can end your treatment. You can test numbness by very lightly touching your skin. The touch should be so light that you do not see the skin dimple from the pressure of your fingertip. When using ice, most people will feel these normal sensations in this order: cold, burning, aching, and numbness.   You will need to follow up with your primary healthcare provider in 1-2 weeks for reassessment.  Return to ER for new or worsening symptoms, any additional concerns.

## 2016-08-13 NOTE — ED Provider Notes (Signed)
[facility]-EMERGENCY DEPT Provider Note   CSN: [id number] Arrival date & time: 08/12/16  2326     History   Chief Complaint Chief Complaint  Patient presents with  . Back Pain    HPI Melvin Berry is a 21 y.o. male.  The history is provided by the patient and medical records. No language interpreter was used.  Back Pain   Pertinent negatives include no chest pain and no fever.   Melvin Berry is an otherwise healthy 21 y.o. male  who presents to the Emergency Department complaining of Right upper back pain 1 week. He has been lifting weights at the gym for approximately one month now and thinks he may have pulled something, but denies any known injury or trauma. Pain is worse with certain movements and when sleeping on his right side. He has taken  Aleve with little improvement in symptoms. No shoulder, neck or low back pain. No numbness, tingling or muscle weakness. No fevers, cough, congestion.  History reviewed. No pertinent past medical history.  There are no active problems to display for this patient.   History reviewed. No pertinent surgical history.     Home Medications    Prior to Admission medications   Medication Sig Start Date End Date Taking? Authorizing Provider  albuterol (PROVENTIL HFA;VENTOLIN HFA) 108 (90 BASE) MCG/ACT inhaler Inhale 2 puffs into the lungs every 4 (four) hours as needed for wheezing or shortness of breath. Patient not taking: Reported on 07/04/2014 04/19/14   Harle Battiest, NP  dextromethorphan-guaiFENesin [facility] DM) 30-600 MG per 12 hr tablet Take 1 tablet by mouth 2 (two) times daily. Patient not taking: Reported on 07/04/2014 04/19/14   Harle Battiest, NP  diphenhydrAMINE (BENADRYL) 25 MG tablet Take 25 mg by mouth every 6 (six) hours as needed for allergies.    Historical Provider, MD  ibuprofen (ADVIL,MOTRIN) 600 MG tablet Take 1 tablet (600 mg total) by mouth every 6 (six) hours as needed. 10/03/13   Tatyana Kirichenko, PA-C    methocarbamol (ROBAXIN) 500 MG tablet Take 1 tablet (500 mg total) by mouth 2 (two) times daily as needed for muscle spasms. 08/13/16   Chase Picket Ward, PA-C  naproxen (NAPROSYN) 500 MG tablet Take 1 tablet (500 mg total) by mouth 2 (two) times daily as needed for moderate pain. 08/13/16   Chase Picket Ward, PA-C    Family History History reviewed. No pertinent family history.  Social History Social History  Substance Use Topics  . Smoking status: Never Smoker  . Smokeless tobacco: Never Used  . Alcohol use No     Allergies   Patient has no known allergies.   Review of Systems Review of Systems  Constitutional: Negative for chills and fever.  HENT: Negative for congestion.   Respiratory: Negative for cough and shortness of breath.   Cardiovascular: Negative for chest pain.  Musculoskeletal: Positive for back pain.  Skin: Negative for color change.     Physical Exam Updated Vital Signs BP 135/76 (BP Location: Right Arm)   Pulse 63   Temp 98 F (36.7 C) (Oral)   Resp 18   Ht 5\' 7"  (1.702 m)   Wt 93 kg   SpO2 99%   BMI 32.11 kg/m   Physical Exam  Constitutional: He is oriented to person, place, and time. He appears well-developed and well-nourished. No distress.  HENT:  Head: Normocephalic and atraumatic.  Cardiovascular: Normal rate, regular rhythm and normal heart sounds.   No murmur heard. Pulmonary/Chest: Effort normal and  breath sounds normal. No respiratory distress.  Abdominal: Soft. He exhibits no distension. There is no tenderness.  Musculoskeletal:       Arms: No midline C/T/L-spine tenderness. Full range of motion of the back and shoulders. 5/5 muscle strength in all 4 extremities.   Neurological: He is alert and oriented to person, place, and time.  All 4 extremities neurovascularly intact.  Skin: Skin is warm and dry.  Nursing note and vitals reviewed.    ED Treatments / Results  Labs (all labs ordered are listed, but only abnormal results  are displayed) Labs Reviewed - No data to display  EKG  EKG Interpretation None       Radiology No results found.  Procedures Procedures (including critical care time)  Medications Ordered in ED Medications - No data to display   Initial Impression / Assessment and Plan / ED Course  I have reviewed the triage vital signs and the nursing notes.  Pertinent labs & imaging results that were available during my care of the patient were reviewed by me and considered in my medical decision making (see chart for details).    Melvin Berry presents with right upper back pain consistent with muscle strain. Symptomatic home care instructions discussed. Rx for Robaxin and naproxen given. PCP follow-up if symptoms do not improve. Reasons to return to ER discussed and all questions answered.  Final Clinical Impressions(s) / ED Diagnoses   Final diagnoses:  Muscle strain of right upper back, initial encounter    New Prescriptions New Prescriptions   METHOCARBAMOL (ROBAXIN) 500 MG TABLET    Take 1 tablet (500 mg total) by mouth 2 (two) times daily as needed for muscle spasms.   NAPROXEN (NAPROSYN) 500 MG TABLET    Take 1 tablet (500 mg total) by mouth 2 (two) times daily as needed for moderate pain.     [facility] Surgery Center LLCJaime Pilcher Ward, PA-C 08/13/16 0032    Dione Boozeavid Glick, MD 08/13/16 [phone]

## 2017-02-24 ENCOUNTER — Encounter (HOSPITAL_COMMUNITY): Payer: Self-pay | Admitting: *Deleted

## 2017-02-24 ENCOUNTER — Emergency Department (HOSPITAL_COMMUNITY): Payer: BLUE CROSS/BLUE SHIELD

## 2017-02-24 ENCOUNTER — Emergency Department (HOSPITAL_COMMUNITY)
Admission: EM | Admit: 2017-02-24 | Discharge: 2017-02-24 | Disposition: A | Discharge status: Home / Self Care | Payer: BLUE CROSS/BLUE SHIELD | Attending: Emergency Medicine | Admitting: Emergency Medicine

## 2017-02-24 DIAGNOSIS — J069 Acute upper respiratory infection, unspecified: Secondary | ICD-10-CM | POA: Diagnosis not present

## 2017-02-24 DIAGNOSIS — R0602 Shortness of breath: Secondary | ICD-10-CM | POA: Diagnosis not present

## 2017-02-24 DIAGNOSIS — R05 Cough: Secondary | ICD-10-CM | POA: Diagnosis present

## 2017-02-24 MED ORDER — ALBUTEROL SULFATE (2.5 MG/3ML) 0.083% IN NEBU
5.0000 mg | INHALATION_SOLUTION | Freq: Once | RESPIRATORY_TRACT | Status: AC
  Administered 2017-02-24: 5 mg via RESPIRATORY_TRACT

## 2017-02-24 MED ORDER — ALBUTEROL SULFATE HFA 108 (90 BASE) MCG/ACT IN AERS
1.0000 | INHALATION_SPRAY | Freq: Once | RESPIRATORY_TRACT | Status: AC
  Administered 2017-02-24: 1 via RESPIRATORY_TRACT
  Filled 2017-02-24: qty 6.7

## 2017-02-24 MED ORDER — ALBUTEROL SULFATE (2.5 MG/3ML) 0.083% IN NEBU
INHALATION_SOLUTION | RESPIRATORY_TRACT | Status: AC
  Filled 2017-02-24: qty 6

## 2017-02-24 MED ORDER — PROMETHAZINE-DM 6.25-15 MG/5ML PO SYRP: 5.0000 mL | ORAL_SOLUTION | Freq: Four times a day (QID) | ORAL | 0 refills | Status: AC | PRN

## 2017-02-24 NOTE — ED Triage Notes (Signed)
Pt has had mild sob for the past week. Woke up this morning with worsening sob and dry cough; lung sounds diminished in triage.

## 2017-02-24 NOTE — ED Provider Notes (Signed)
[facility]-EMERGENCY DEPT Provider Note   CSN: [id number] Arrival date & time: 02/24/17  0315     History   Chief Complaint Chief Complaint  Patient presents with  . Shortness of Breath    HPI Melvin Berry is a 21 y.o. male with no significant past medical history presents to the emergency department today for cough. Patient states that last week he started developing a nonproductive cough with associated sore throat, headache and lower extremity myalgias. He states that the symptoms persisted until Monday when the headache and sore throat for relief. He states she has had a lingering nonproductive cough. He states that he felt mildly short of breath this morning when he woke up out of his sleep after having a coughing spell. He has tried Mucinex, Sudafed for this with mild relief. He states that he has been hydrating with oral water and tea. He received a breathing treatment in triage that he states relieved his symptoms. Denies fever, chills, arthralgia's, night sweats, weight loss, travel, DOE, sputum production, hemoptysis, sinus pain or rhinorrhea. No sick contacts. *Never smoker. No new medications or use of ACE-I. No relation to food. No history of asthma, COPD, or CHF. Patient is not immunocompromised (HIV, chronic steroid use, etc.).    HPI  History reviewed. No pertinent past medical history.  There are no active problems to display for this patient.   History reviewed. No pertinent surgical history.     Home Medications    Prior to Admission medications   Medication Sig Start Date End Date Taking? Authorizing Provider  albuterol (PROVENTIL HFA;VENTOLIN HFA) 108 (90 BASE) MCG/ACT inhaler Inhale 2 puffs into the lungs every 4 (four) hours as needed for wheezing or shortness of breath. Patient not taking: Reported on 07/04/2014 04/19/14   Harle Battiestysinger, Elizabeth, NP  promethazine-dextromethorphan (PROMETHAZINE-DM) 6.25-15 MG/5ML syrup Take 5 mLs by mouth 4 (four) times daily as needed  for cough. 02/24/17   Tait Balistreri, Elmer SowMichael M, PA-C    Family History No family history on file.  Social History Social History  Substance Use Topics  . Smoking status: Never Smoker  . Smokeless tobacco: Never Used  . Alcohol use No     Allergies   Patient has no known allergies.   Review of Systems Review of Systems  All other systems reviewed and are negative.    Physical Exam Updated Vital Signs BP 139/86 (BP Location: Left Arm)   Pulse 71   Temp 97.6 F (36.4 C) (Oral)   Resp 16   Ht 5\' 7"  (1.702 m)   Wt 102.1 kg (225 lb)   SpO2 100%   BMI 35.24 kg/m   Physical Exam  Constitutional: He appears well-developed and well-nourished.  Non-toxic appearing  HENT:  Head: Normocephalic and atraumatic.  Right Ear: Tympanic membrane and external ear normal.  Left Ear: Tympanic membrane and external ear normal.  Nose: Nose normal. No mucosal edema. Right sinus exhibits no maxillary sinus tenderness and no frontal sinus tenderness. Left sinus exhibits no maxillary sinus tenderness and no frontal sinus tenderness.  Mouth/Throat: Uvula is midline, oropharynx is clear and moist and mucous membranes are normal. No tonsillar exudate.  Eyes: Pupils are equal, round, and reactive to light. Right eye exhibits no discharge. Left eye exhibits no discharge. No scleral icterus.  Neck: Trachea normal, normal range of motion and full passive range of motion without pain. Neck supple. No JVD present. No spinous process tenderness present. No neck rigidity. Normal range of motion present.  Cardiovascular: Normal  rate, regular rhythm and intact distal pulses.   No murmur heard. Pulses:      Radial pulses are 2+ on the right side, and 2+ on the left side.       Dorsalis pedis pulses are 2+ on the right side, and 2+ on the left side.       Posterior tibial pulses are 2+ on the right side, and 2+ on the left side.  No lower extremity swelling or edema. Calves symmetric in size bilaterally.    Pulmonary/Chest: Effort normal and breath sounds normal. No respiratory distress. He exhibits no tenderness.  Musculoskeletal: He exhibits no edema.  Lymphadenopathy:    He has no cervical adenopathy.  Neurological: He is alert.  Skin: Skin is warm and dry. No rash noted. He is not diaphoretic.  Psychiatric: He has a normal mood and affect.  Nursing note and vitals reviewed.    ED Treatments / Results  Labs (all labs ordered are listed, but only abnormal results are displayed) Labs Reviewed - No data to display  EKG  EKG Interpretation None       Radiology Dg Chest 2 View  Result Date: 02/24/2017 CLINICAL DATA:  Shortness of breath for a week. Now with worsening shortness of breath and cough. EXAM: CHEST  2 VIEW COMPARISON:  None. FINDINGS: The heart size and mediastinal contours are within normal limits. Both lungs are clear. The visualized skeletal structures are unremarkable. IMPRESSION: No active cardiopulmonary disease. Electronically Signed   By: Burman Nieves M.D.   On: 02/24/2017 04:06    Procedures Procedures (including critical care time)  Medications Ordered in ED Medications  albuterol (PROVENTIL) (2.5 MG/3ML) 0.083% nebulizer solution (not administered)  albuterol (PROVENTIL HFA;VENTOLIN HFA) 108 (90 Base) MCG/ACT inhaler 1-2 puff (not administered)  albuterol (PROVENTIL) (2.5 MG/3ML) 0.083% nebulizer solution 5 mg (5 mg Nebulization Given 02/24/17 0323)     Initial Impression / Assessment and Plan / ED Course  I have reviewed the triage vital signs and the nursing notes.  Pertinent labs & imaging results that were available during my care of the patient were reviewed by me and considered in my medical decision making (see chart for details).      Pt CXR negative for acute infiltrate. Patients symptoms are consistent with URI, likely viral etiology. Discussed that antibiotics are not indicated for viral infections. Pt will be discharged with  symptomatic treatment. Albuterol MDI given in department. Verbalizes understanding and is agreeable with plan. Pt is hemodynamically stable & in NAD prior to dc.   Final Clinical Impressions(s) / ED Diagnoses   Final diagnoses:  Viral upper respiratory tract infection    New Prescriptions New Prescriptions   PROMETHAZINE-DEXTROMETHORPHAN (PROMETHAZINE-DM) 6.25-15 MG/5ML SYRUP    Take 5 mLs by mouth 4 (four) times daily as needed for cough.     Jacinto Halim, PA-C 02/24/17 0865    Ward, Layla Maw, DO 02/24/17 [phone]

## 2017-02-24 NOTE — Discharge Instructions (Signed)
Please read and follow all provided instructions.  Your diagnoses today include:  1. Viral upper respiratory tract infection     Tests performed today include: Vital signs. See below for your results today.   Medications prescribed/advised:  1. Musinex [Guaifenesin] as a decongestant [thin mucus - Continue taking this at home.  2. Tylenol for fever/pain and Motrin/Ibuprofen for muscle aches 3. Albuterol inhaler - given in department. Handout provided. Use as needed for Shortness of breath and wheezing.  5. Cough Suppressant: Take as needed. Do not drive or operate heavy machinery when using this medication.   Home care instructions:  An upper respiratory infection (URI) is also sometimes known as the common cold. Most people improve within 1 week, but symptoms can last up to 2 weeks. A residual cough may last even longer.   URI is most commonly caused by a virus. Viruses are NOT treated with antibiotics. You can easily spread the virus to others by oral contact. This includes kissing, sharing a glass, coughing, or sneezing. Touching your mouth or nose and then touching a surface, which is then touched by another person, can also spread the virus.   TREATMENT  Treatment is directed at relieving symptoms. There is no cure. Antibiotics are not effective, because the infection is caused by a virus, not by bacteria. Treatment may include:  Increased fluid intake. Sports drinks offer valuable electrolytes, sugars, and fluids.  Breathing heated mist or steam (vaporizer or shower).  Eating chicken soup or other clear broths, and maintaining good nutrition.  Getting plenty of rest.  Using gargles or lozenges for comfort.  Controlling fevers with ibuprofen or acetaminophen as directed by your caregiver.  Increasing usage of your inhaler if you have asthma.  Return to work when your temperature has returned to normal.   Follow-up instructions: Followup with your primary care doctor in 4 days if  your symptoms persist.  Your more than welcome to return to the emergency department if symptoms worsen or become concerning.  Return instructions:  Please return to the Emergency Department if you do not get better, if you get worse, or new symptoms OR  - Fever (temperature greater than 101.72F)  - Bleeding that does not stop with holding pressure to the area    -Severe pain (please note that you may be more sore the day after your accident)  - Chest Pain  - Difficulty breathing (worsening shortness of breath with sputum production may  be a sign of pneumonia.   - Severe nausea or vomiting  - Inability to tolerate food and liquids  - Passing out  - Skin becoming red around your wounds  - Change in mental status (confusion or lethargy)  - New numbness or weakness     -You develop fever, swollen neck glands, pain with swallowing or white areas on  the back of your throat. This may be a sign of strep throat.  Please return if you have any other emergent concerns.  Additional Information:  Your vital signs today were: BP 139/86 (BP Location: Left Arm)    Pulse 71    Temp 97.6 F (36.4 C) (Oral)    Resp 16    Ht  (1.702 m)    Wt 102.1 kg (225 lb)    SpO2 100%    BMI 35.24 kg/m  If your blood pressure (BP) was elevated above 135/85 this visit, please have this repeated by your doctor within one month.

## 2017-02-24 NOTE — ED Notes (Signed)
Pt verbalized understanding of d/c instructions and has no further questions. Pt is stable, A&Ox4, VSS.  

## 2022-02-26 ENCOUNTER — Encounter: Payer: Self-pay | Admitting: Podiatry

## 2022-02-26 ENCOUNTER — Ambulatory Visit: Payer: 59 | Admitting: Podiatry

## 2022-02-26 DIAGNOSIS — B353 Tinea pedis: Secondary | ICD-10-CM

## 2022-02-26 MED ORDER — KETOCONAZOLE 2 % EX CREA: 1.0000 | TOPICAL_CREAM | Freq: Two times a day (BID) | CUTANEOUS | 2 refills | Status: AC

## 2022-02-26 MED ORDER — TERBINAFINE HCL 250 MG PO TABS: 250.0000 mg | ORAL_TABLET | Freq: Every day | ORAL | 0 refills | Status: AC

## 2022-02-26 NOTE — Progress Notes (Signed)
  Subjective:  Patient ID: Melvin Berry, male    DOB: 12-Feb-1996,  MRN: [id number]  Chief Complaint  Patient presents with   Rash     np bil rash causing discomfort right foot worse than left - He's been using a steroid cream and an antifungal OTC for a year or so and it's just not getting bette. Sometimes itchy, painful on the bottom when it's dry    26 y.o. male presents with the above complaint. History confirmed with patient.   Objective:  Physical Exam: warm, good capillary refill, no trophic changes or ulcerative lesions, normal DP and PT pulses, normal sensory exam, and tinea pedis left third interspace, right fourth toe  Assessment:   1. Tinea pedis of both feet      Plan:  Patient was evaluated and treated and all questions answered.   Discussed the etiology and treatment options for tinea pedis.  Discussed topical and oral treatment.  Recommended oral and topical treatment with 30-day p.o. Lamisil and 2% ketoconazole cream.  This was sent to the patient's pharmacy.  Also discussed appropriate foot hygiene, use of antifungal spray such as Tinactin in shoes, as well as cleaning foot surfaces such as showers and bathroom floors with bleach.  Advised if not improving would plan for a skin biopsy   Return if symptoms worsen or fail to improve.

## 2023-10-18 ENCOUNTER — Emergency Department

## 2023-10-18 ENCOUNTER — Other Ambulatory Visit: Payer: Self-pay

## 2023-10-18 ENCOUNTER — Emergency Department
Admission: EM | Admit: 2023-10-18 | Discharge: 2023-10-18 | Disposition: A | Discharge status: Home / Self Care | Attending: Emergency Medicine | Admitting: Emergency Medicine

## 2023-10-18 DIAGNOSIS — B349 Viral infection, unspecified: Secondary | ICD-10-CM | POA: Diagnosis not present

## 2023-10-18 DIAGNOSIS — R0789 Other chest pain: Secondary | ICD-10-CM | POA: Diagnosis present

## 2023-10-18 LAB — CBC
HCT: 41.9 % (ref 39.0–52.0)
Hemoglobin: 14.5 g/dL (ref 13.0–17.0)
MCH: 29.4 pg (ref 26.0–34.0)
MCHC: 34.6 g/dL (ref 30.0–36.0)
MCV: 85 fL (ref 80.0–100.0)
Platelets: 326 10*3/uL (ref 150–400)
RBC: 4.93 MIL/uL (ref 4.22–5.81)
RDW: 12.6 % (ref 11.5–15.5)
WBC: 9.2 10*3/uL (ref 4.0–10.5)
nRBC: 0 % (ref 0.0–0.2)

## 2023-10-18 LAB — BASIC METABOLIC PANEL WITH GFR
Anion gap: 10 (ref 5–15)
BUN: 17 mg/dL (ref 6–20)
CO2: 25 mmol/L (ref 22–32)
Calcium: 8.9 mg/dL (ref 8.9–10.3)
Chloride: 105 mmol/L (ref 98–111)
Creatinine, Ser: 0.86 mg/dL (ref 0.61–1.24)
GFR, Estimated: >60 mL/min (ref >60)
Glucose, Bld: 100 mg/dL — ABNORMAL HIGH (ref 70–99)
Potassium: 3.5 mmol/L (ref 3.5–5.1)
Sodium: 140 mmol/L (ref 135–145)

## 2023-10-18 LAB — TROPONIN I (HIGH SENSITIVITY): Troponin I (High Sensitivity): 3 ng/L (ref <18)

## 2023-10-18 MED ORDER — KETOROLAC TROMETHAMINE 30 MG/ML IJ SOLN
30.0000 mg | Freq: Once | INTRAMUSCULAR | Status: AC
  Administered 2023-10-18: 30 mg via INTRAMUSCULAR
  Filled 2023-10-18: qty 1

## 2023-10-18 NOTE — Discharge Instructions (Addendum)
 Please take Tylenol and ibuprofen /Advil  for your pain.  It is safe to take them together, or to alternate them every few hours.  Take up to 1000mg  of Tylenol at a time, up to 4 times per day.  Do not take more than 4000 mg of Tylenol in 24 hours.  For ibuprofen , take 400-600 mg, 3 - 4 times per day.  Use pseudoephedrine/Sudafed to help with congestion and thinning mucus.  It is safe to use this medicine alongside Tylenol and ibuprofen .  Maximum 240 mg per 24 hours.  Drink plenty of water while using this medicine.

## 2023-10-18 NOTE — ED Provider Notes (Signed)
 [facility] Provider Note    Event Date/Time   First MD Initiated Contact with Patient 10/18/23 [phone]     (approximate)   History   Chest Pain   HPI  Melvin Berry is a 28 y.o. male who presents to the ED for evaluation of Chest Pain   Review a routine PCP visit from 2 years ago.  Generally healthy.  Patient presents to the ED for evaluation of chest pressure last night and this morning.  Reports has been dealing with a URI for the past couple weeks, has been to urgent care twice, 2 negative strep tests but was given a round of amoxicillin recently that finished 3 days ago.  Reports some chest pressure, ear pressure bilaterally and head pressure last night and again this morning after awakening.  No dizziness or syncopal episodes.  No shortness of breath, fevers, abdominal pain or emesis.  Due to his chest discomfort and continued symptoms he wanted to make sure he did not have pneumonia.  Has not yet taken any medications this morning.   Physical Exam   Triage Vital Signs: ED Triage Vitals [10/18/23 0601]  Encounter Vitals Group     BP (!) 137/95     Systolic BP Percentile      Diastolic BP Percentile      Pulse Rate 95     Resp 18     Temp 100.2 F (37.9 C)     Temp Source Oral     SpO2 94 %     Weight      Height      Head Circumference      Peak Flow      Pain Score 4     Pain Loc      Pain Education      Exclude from Growth Chart     Most recent vital signs: Vitals:   10/18/23 0601  BP: (!) 137/95  Pulse: 95  Resp: 18  Temp: 100.2 F (37.9 C)  SpO2: 94%    General: Awake, no distress.  Occasional coughing but generally well-appearing and conversational. CV:  Good peripheral perfusion.  RRR without appreciable murmur Resp:  Normal effort.  No wheezing Abd:  No distention.  Soft MSK:  No deformity noted.  No swelling Neuro:  No focal deficits appreciated. Other:  Bilateral TMs without signs of AOM.  Tonsils 2+ bilaterally,  mildly erythematous posterior oropharynx.  No exudate.  No signs of PTA or uvular deviation or upper airway obstruction.   ED Results / Procedures / Treatments   Labs (all labs ordered are listed, but only abnormal results are displayed) Labs Reviewed  BASIC METABOLIC PANEL WITH GFR - Abnormal; Notable for the following components:      Result Value   Glucose, Bld 100 (*)    All other components within normal limits  CBC  TROPONIN I (HIGH SENSITIVITY)  TROPONIN I (HIGH SENSITIVITY)    EKG Sinus rhythm with a rate of 79 bpm.  Normal axis and intervals.  No for signs of acute ischemia.  No comparison. T wave inversion to lead III.  RADIOLOGY CXR interpreted by me without evidence of acute cardiopulmonary pathology.  Official radiology report(s): DG Chest 2 View Result Date: 10/18/2023 CLINICAL DATA:  28 year old male who woke with coughing and choking, chest pain. EXAM: CHEST - 2 VIEW COMPARISON:  Chest radiographs 02/24/2017. FINDINGS: PA and lateral views ear 0635 hours. Lung volumes and mediastinal contours remain normal. Visualized tracheal air  column is within normal limits. Both lungs are clear. No pneumothorax or pleural effusion. Negative visible bowel gas and osseous structures. IMPRESSION: Negative.  No cardiopulmonary abnormality. Electronically Signed   By: Marlise Simpers M.D.   On: 10/18/2023 07:21    PROCEDURES and INTERVENTIONS:  .1-3 Lead EKG Interpretation  Performed by: Arline Bennett, MD Authorized by: Arline Bennett, MD     Interpretation: normal     ECG rate:  90   ECG rate assessment: normal     Rhythm: sinus rhythm     Ectopy: none     Conduction: normal     Medications  ketorolac (TORADOL) 30 MG/ML injection 30 mg (has no administration in time range)     IMPRESSION / MDM / ASSESSMENT AND PLAN / ED COURSE  I reviewed the triage vital signs and the nursing notes.  Differential diagnosis includes, but is not limited to, ACS, PTX, PNA, muscle strain/spasm,  PE, dissection, anxiety, pleural effusion  {Patient presents with symptoms of an acute illness or injury that is potentially life-threatening.  Patient presents with symptoms of a continued viral syndrome.  Presenting borderline temperature is noted but I doubt sepsis or bacterial infectious etiology of his symptoms.  Has a normal cardiopulmonary exam and looks well.  Suspect much of his symptoms are related to middle ear pressure, sinus pressure from a viral syndrome.  Has a normal CBC and metabolic panel, clear CXR, negative troponin and nonischemic EKG.  We discussed OTC medications and ED return precautions.      FINAL CLINICAL IMPRESSION(S) / ED DIAGNOSES   Final diagnoses:  Other chest pain  Acute viral syndrome     Rx / DC Orders   ED Discharge Orders     None        Note:  This document was prepared using Dragon voice recognition software and may include unintentional dictation errors.   Arline Bennett, MD 10/18/23 [phone]

## 2023-10-18 NOTE — ED Triage Notes (Signed)
 Pt to ED from home after waking this morning coughing and "choking" on mucus and has had right sided chest pain since. Pt states he has had a URI for two weeks and finished ABX for tonsilitis on Thursday. Denies fever, SOB, N/V.

## 2023-10-18 NOTE — ED Notes (Signed)
 See triage note  Presents with some cough and chest discomfort.. Low grade temp on arrival

## 2024-06-10 ENCOUNTER — Emergency Department (HOSPITAL_COMMUNITY)

## 2024-06-10 ENCOUNTER — Other Ambulatory Visit: Payer: Self-pay

## 2024-06-10 ENCOUNTER — Emergency Department (HOSPITAL_COMMUNITY)
Admission: EM | Admit: 2024-06-10 | Discharge: 2024-06-11 | Disposition: A | Discharge status: Home / Self Care | Attending: Emergency Medicine | Admitting: Emergency Medicine

## 2024-06-10 DIAGNOSIS — X509XXA Other and unspecified overexertion or strenuous movements or postures, initial encounter: Secondary | ICD-10-CM | POA: Diagnosis not present

## 2024-06-10 DIAGNOSIS — S86012A Strain of left Achilles tendon, initial encounter: Secondary | ICD-10-CM | POA: Diagnosis not present

## 2024-06-10 DIAGNOSIS — Y9367 Activity, basketball: Secondary | ICD-10-CM | POA: Diagnosis not present

## 2024-06-10 DIAGNOSIS — S99912A Unspecified injury of left ankle, initial encounter: Secondary | ICD-10-CM | POA: Diagnosis present

## 2024-06-10 MED ORDER — HYDROCODONE-ACETAMINOPHEN 5-325 MG PO TABS
1.0000 | ORAL_TABLET | Freq: Once | ORAL | Status: AC
  Administered 2024-06-10: 1 via ORAL
  Filled 2024-06-10: qty 1

## 2024-06-10 MED ORDER — ONDANSETRON 4 MG PO TBDP
4.0000 mg | ORAL_TABLET | Freq: Once | ORAL | Status: AC
  Administered 2024-06-10: 4 mg via ORAL
  Filled 2024-06-10: qty 1

## 2024-06-10 NOTE — ED Provider Triage Note (Signed)
 Emergency Medicine Provider Triage Evaluation Note  Melvin Berry , a 28 y.o. male  was evaluated in triage.  Pt complains of pain to the left Achilles tendon insertion site.  He states he heard a loud pop while playing basketball.  He states he does stretch before him.  Plays basketball occasionally.  Review of Systems  Positive: As above Negative: As above  Physical Exam  BP 128/80   Pulse (!) 107   Temp 98 F (36.7 C)   Resp 18   SpO2 98%  Gen:   Awake, no distress   Resp:  Normal effort  MSK:   Moves extremities without difficulty  Other:    Medical Decision Making  Medically screening exam initiated at 8:29 PM.  Appropriate orders placed.  Melvin Berry was informed that the remainder of the evaluation will be completed by another provider, this initial triage assessment does not replace that evaluation, and the importance of remaining in the ED until their evaluation is complete.  Unable to get a good physical exam due to patient's pain level and position.  Will obtain x-ray and provide pain control   Hildegard Loge, PA-C 06/10/24 2029

## 2024-06-10 NOTE — ED Triage Notes (Signed)
 Patient reports possibly achilles injury while playing basketball, went to drive to the basket and planted his foot then heard a loud pop. Patient is able to flex and dorsiflex foot but reports significant pain at the posterior ankle.

## 2024-06-11 MED ORDER — IBUPROFEN 600 MG PO TABS: 600.0000 mg | ORAL_TABLET | Freq: Four times a day (QID) | ORAL | 0 refills | Status: AC | PRN

## 2024-06-11 MED ORDER — OXYCODONE-ACETAMINOPHEN 5-325 MG PO TABS: 1.0000 | ORAL_TABLET | Freq: Four times a day (QID) | ORAL | 0 refills | Status: AC | PRN

## 2024-06-11 MED ORDER — OXYCODONE-ACETAMINOPHEN 5-325 MG PO TABS
1.0000 | ORAL_TABLET | Freq: Once | ORAL | Status: AC
  Administered 2024-06-11: 1 via ORAL
  Filled 2024-06-11: qty 1

## 2024-06-11 NOTE — ED Provider Notes (Signed)
 " [place] EMERGENCY DEPARTMENT AT [facility] Provider Note   CSN: [id number] Arrival date & time: 06/10/24  8062     Patient presents with: Foot Injury   Melvin Berry is a 28 y.o. male.   The history is provided by the patient.  Foot Injury [name] is a 28 y.o. male who presents to the Emergency Department complaining of ankle injury.  He presents to the emergency department for evaluation of left ankle injury that occurred while he was playing basketball.  He states that he was playing when he felt a pop to the back of his leg and developed severe pain.  He cannot bear weight to the left lower extremity and feels like there is cramping in his calf.  He has no known medical problems and takes no routine medications.  No prior orthopedic surgery.  He works in holiday [employer].     Prior to Admission medications  Medication Sig Start Date End Date Taking? Authorizing Provider  ibuprofen  (ADVIL ) 600 MG tablet Take 1 tablet (600 mg total) by mouth every 6 (six) hours as needed. 06/11/24  Yes Griselda Norris, MD  oxyCODONE -acetaminophen  (PERCOCET/ROXICET) 5-325 MG tablet Take 1 tablet by mouth every 6 (six) hours as needed for severe pain (pain score 7-10). 06/11/24  Yes Griselda Norris, MD  albuterol  (PROVENTIL  HFA;VENTOLIN  HFA) 108 (90 BASE) MCG/ACT inhaler Inhale 2 puffs into the lungs every 4 (four) hours as needed for wheezing or shortness of breath. Patient not taking: Reported on 07/04/2014 04/19/14   Bulah Norris, NP  ketoconazole  (NIZORAL ) 2 % cream Apply 1 Application topically 2 (two) times daily. 02/26/22   McDonald, Juliene SAUNDERS, DPM  promethazine -dextromethorphan (PROMETHAZINE -DM) 6.25-15 MG/5ML syrup Take 5 mLs by mouth 4 (four) times daily as needed for cough. 02/24/17   Maczis, Michael M, PA-C  terbinafine  (LAMISIL ) 250 MG tablet Take 1 tablet (250 mg total) by mouth daily. 02/26/22   Silva Juliene SAUNDERS, DPM    Allergies: Patient has no known allergies.    Review of  Systems  All other systems reviewed and are negative.   Updated Vital Signs BP 122/69 (BP Location: Right Arm)   Pulse 76   Temp 98.4 F (36.9 C) (Oral)   Resp 15   SpO2 100%   Physical Exam Constitutional:      Appearance: Normal appearance.  HENT:     Head: Normocephalic and atraumatic.  Cardiovascular:     Rate and Rhythm: Normal rate and regular rhythm.  Pulmonary:     Effort: Pulmonary effort is normal. No respiratory distress.  Abdominal:     Palpations: Abdomen is soft.     Tenderness: There is no abdominal tenderness.  Musculoskeletal:     Comments: 2+ DP pulses bilaterally.  There is mild soft tissue swelling to the left posterior ankle, unable to palpate Achilles tendon.  There is no plantarflexion on squeeze of the calf on the left leg.     (all labs ordered are listed, but only abnormal results are displayed) Labs Reviewed - No data to display  EKG: None  Radiology: DG Ankle Complete Left Result Date: 06/10/2024 EXAM: 3 OR MORE VIEW(S) XRAY OF THE LEFT ANKLE 06/10/2024 08:40:00 PM CLINICAL HISTORY: basketball injury COMPARISON: None available. FINDINGS: BONES AND JOINTS: No acute fracture. No malalignment. SOFT TISSUES: The soft tissues are unremarkable. IMPRESSION: 1. No acute fracture or dislocation. Electronically signed by: Morgane Naveau MD 06/10/2024 09:58 PM EST RP Workstation: [id number]     Procedures  SPLINT APPLICATION  Date/Time: 7:09 AM Authorized by: Almarie Burner Consent: Verbal consent obtained. Risks and benefits: risks, benefits and alternatives were discussed Consent given by: patient Splint applied by: orthopedic technician Location details: LLE Splint type: posterior stirrup in plantar flexion Supplies used: ace wrap, orthoglass, padding Post-procedure: The splinted body part was neurovascularly unchanged following the procedure. Patient tolerance: Patient tolerated the procedure well with no immediate  complications.   Medications Ordered in the ED  HYDROcodone -acetaminophen  (NORCO/VICODIN) 5-325 MG per tablet 1 tablet (1 tablet Oral Given 06/10/24 2319)  ondansetron  (ZOFRAN -ODT) disintegrating tablet 4 mg (4 mg Oral Given 06/10/24 2319)  oxyCODONE -acetaminophen  (PERCOCET/ROXICET) 5-325 MG per tablet 1 tablet (1 tablet Oral Given 06/11/24 0316)                                    Medical Decision Making Amount and/or Complexity of Data Reviewed Radiology: ordered.  Risk Prescription drug management.   Patient here for evaluation of left ankle pain.  Plain films are negative for acute abnormality-images personally reviewed and interpreted, agree with radiologist interpretation.  Clinically he has a rupture of the Achilles tendon.  Will place in splint.  Discussed with [place]y with orthopedics.  Plan to have him follow-up closely in the office for ongoing management.  Discussed nonweightbearing status.  Discussed pain control.  Discussed outpatient follow-up and return precautions.  No evidence of acute fracture/dislocation.     Final diagnoses:  Rupture of left Achilles tendon, initial encounter    ED Discharge Orders          Ordered    oxyCODONE -acetaminophen  (PERCOCET/ROXICET) 5-325 MG tablet  Every 6 hours PRN        06/11/24 0338    ibuprofen  (ADVIL ) 600 MG tablet  Every 6 hours PRN        06/11/24 0338               Burner Almarie, MD 06/11/24 0710  "

## 2024-06-11 NOTE — Progress Notes (Signed)
 Orthopedic Tech Progress Note Patient Details:  Melvin Berry 13-May-1996 [id number]  Ortho Devices Type of Ortho Device: Ace wrap, Cotton web roll, Short leg splint, Stirrup splint, Crutches Ortho Device/Splint Location: L ACHILLES Ortho Device/Splint Interventions: Ordered, Application, Adjustment   Post Interventions Patient Tolerated: Well Instructions Provided: Care of device  Korbyn Chopin L Kroy Sprung 06/11/2024, 3:59 AM
